# Patient Record
Sex: Male | Born: 1970 | Race: Black or African American | Hispanic: No | Marital: Single | Smoking: Never smoker
Health system: Southern US, Community
[De-identification: ages and names within clinical notes are randomized; demographics above are authoritative.]

## PROBLEM LIST (undated history)

## (undated) DIAGNOSIS — I10 Essential (primary) hypertension: Secondary | ICD-10-CM

## (undated) DIAGNOSIS — M1990 Unspecified osteoarthritis, unspecified site: Secondary | ICD-10-CM

## (undated) DIAGNOSIS — T7840XA Allergy, unspecified, initial encounter: Secondary | ICD-10-CM

## (undated) HISTORY — DX: Allergy, unspecified, initial encounter: T78.40XA

## (undated) HISTORY — DX: Essential (primary) hypertension: I10

## (undated) HISTORY — DX: Unspecified osteoarthritis, unspecified site: M19.90

---

## 1998-03-22 ENCOUNTER — Emergency Department (HOSPITAL_COMMUNITY): Admission: EM | Admit: 1998-03-22 | Discharge: 1998-03-22 | Payer: Self-pay | Admitting: Emergency Medicine

## 1998-08-01 ENCOUNTER — Encounter: Payer: Self-pay | Admitting: Orthopedic Surgery

## 1998-08-01 ENCOUNTER — Ambulatory Visit (HOSPITAL_COMMUNITY): Admission: RE | Admit: 1998-08-01 | Discharge: 1998-08-01 | Payer: Self-pay | Admitting: Orthopedic Surgery

## 1998-11-14 ENCOUNTER — Emergency Department (HOSPITAL_COMMUNITY): Admission: EM | Admit: 1998-11-14 | Discharge: 1998-11-14 | Payer: Self-pay | Admitting: Emergency Medicine

## 1999-05-28 ENCOUNTER — Emergency Department (HOSPITAL_COMMUNITY): Admission: EM | Admit: 1999-05-28 | Discharge: 1999-05-28 | Payer: Self-pay

## 2000-05-16 ENCOUNTER — Emergency Department (HOSPITAL_COMMUNITY): Admission: EM | Admit: 2000-05-16 | Discharge: 2000-05-16 | Payer: Self-pay | Admitting: Internal Medicine

## 2006-02-08 ENCOUNTER — Encounter: Admission: RE | Admit: 2006-02-08 | Discharge: 2006-02-08 | Payer: Self-pay | Admitting: Nephrology

## 2006-03-29 ENCOUNTER — Encounter: Admission: RE | Admit: 2006-03-29 | Discharge: 2006-03-29 | Payer: Self-pay | Admitting: Nephrology

## 2007-05-30 IMAGING — CR DG CHEST 2V
2 series · 2 of 2 positions shown · non-contrast
Comparison: None

CLINICAL DATA: Cough

CHEST - 2 VIEW:

[w chest pa]
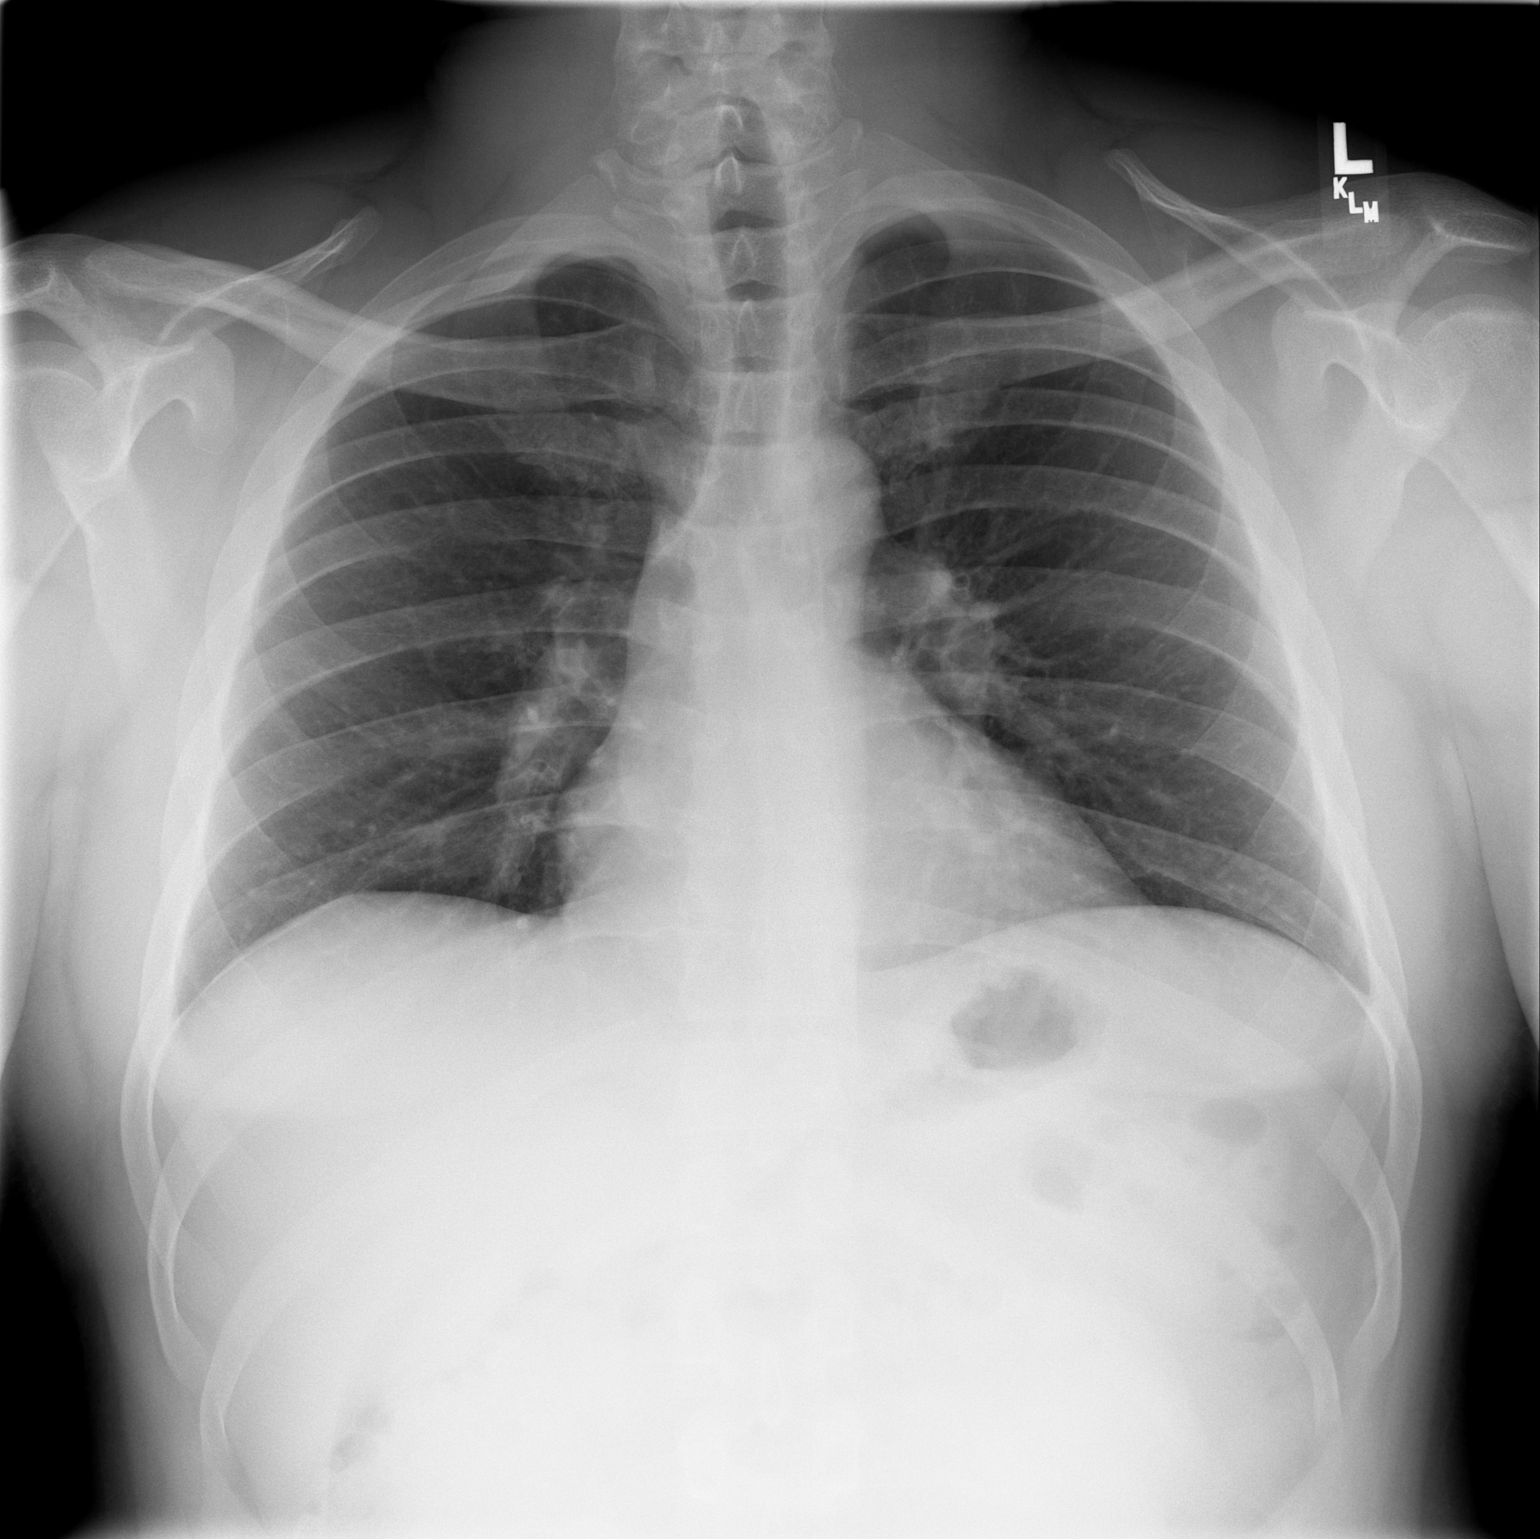

[w chest lat]
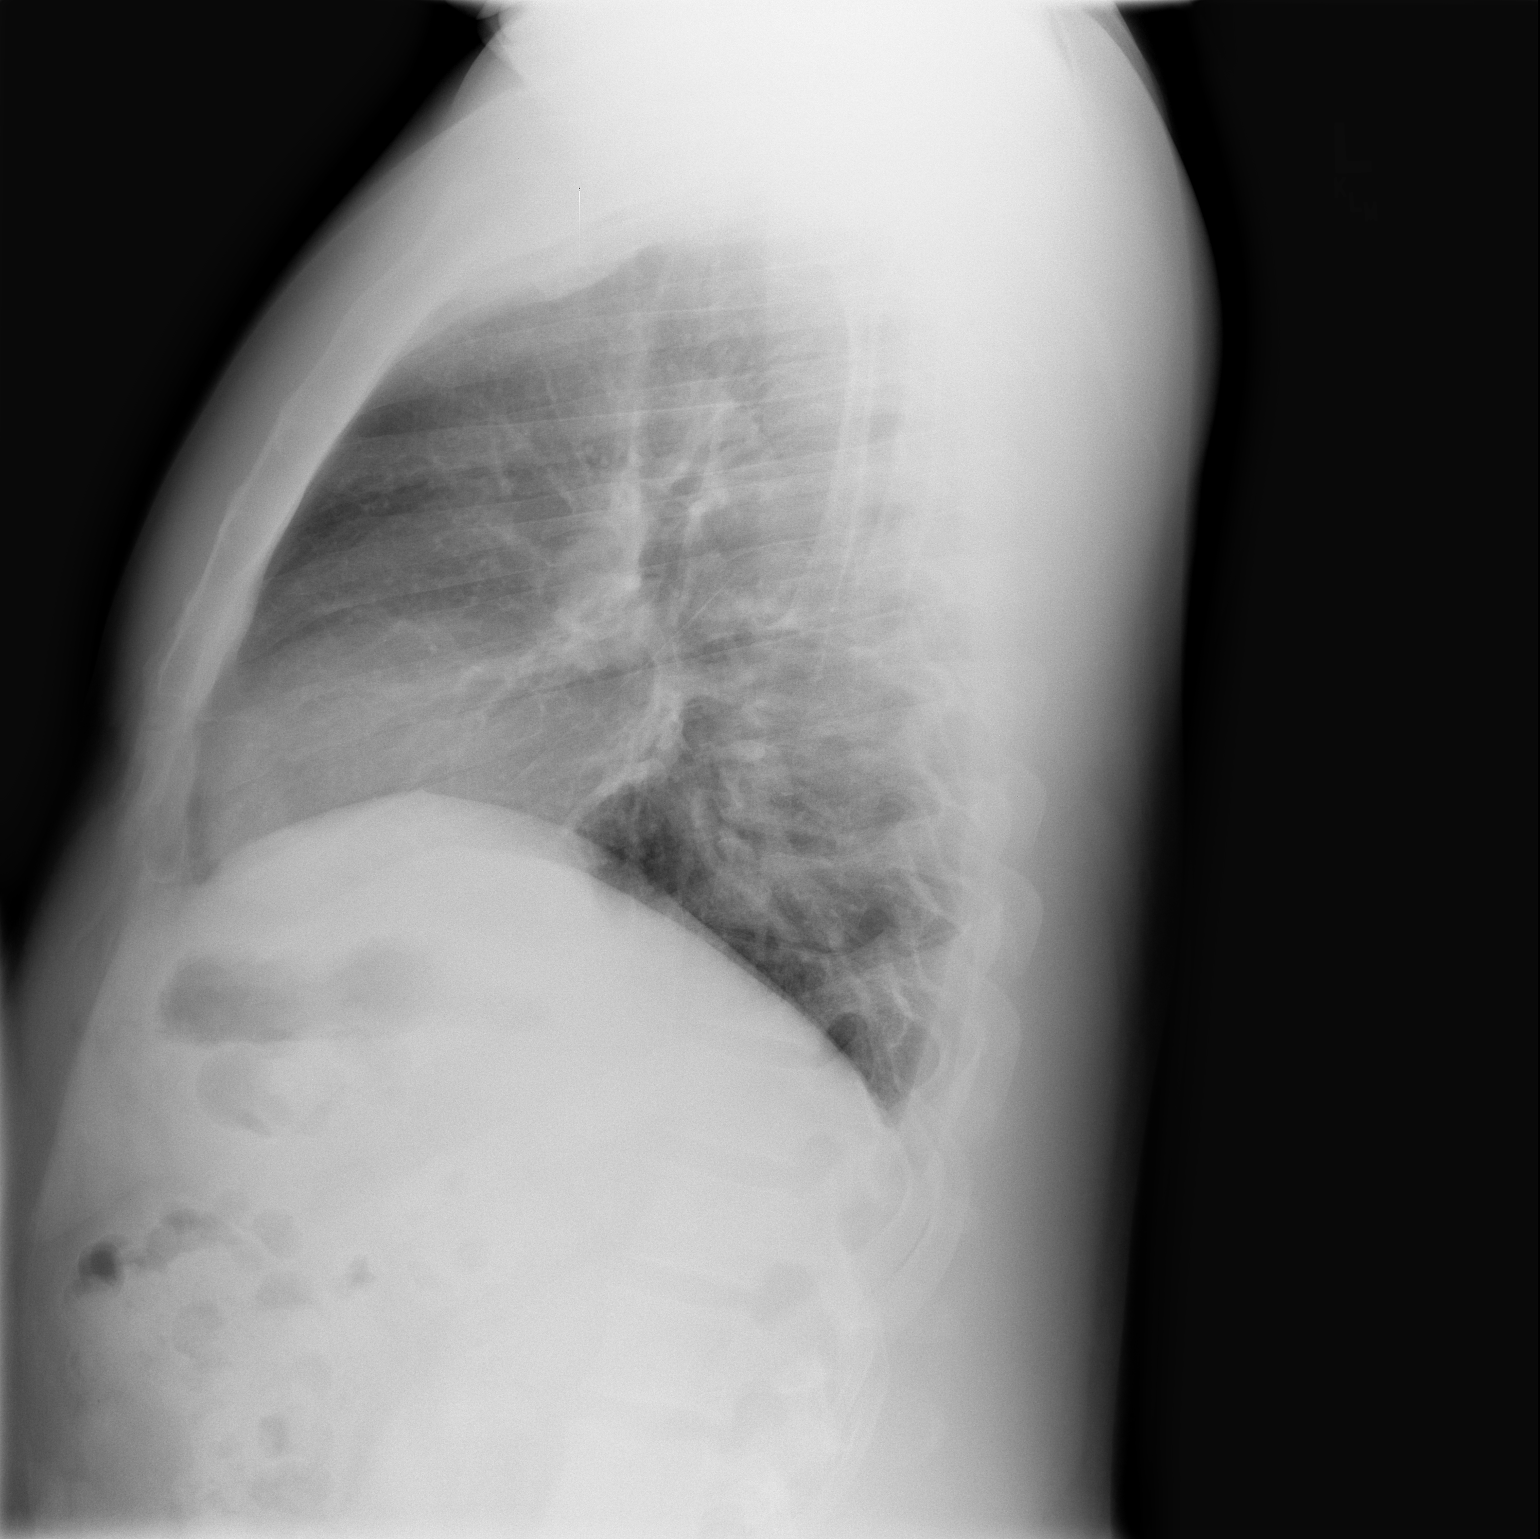

[2 of 2 positions shown; findings below may reference images not displayed]

FINDINGS: The heart size and mediastinal contours are within normal limits. 
Mild peribronchial thickening noted. No focal opacities. The visualized skeletal
structures are unremarkable.
IMPRESSION: Mild bronchitic changes.

## 2009-03-04 ENCOUNTER — Emergency Department (HOSPITAL_COMMUNITY): Admission: EM | Admit: 2009-03-04 | Discharge: 2009-03-04 | Payer: Self-pay | Admitting: Emergency Medicine

## 2009-03-07 ENCOUNTER — Emergency Department (HOSPITAL_COMMUNITY): Admission: EM | Admit: 2009-03-07 | Discharge: 2009-03-07 | Payer: Self-pay | Admitting: Emergency Medicine

## 2009-03-12 ENCOUNTER — Emergency Department (HOSPITAL_COMMUNITY): Admission: EM | Admit: 2009-03-12 | Discharge: 2009-03-12 | Payer: Self-pay | Admitting: Emergency Medicine

## 2010-01-29 ENCOUNTER — Emergency Department (HOSPITAL_COMMUNITY): Admission: EM | Admit: 2010-01-29 | Discharge: 2009-03-02 | Payer: Self-pay | Admitting: Emergency Medicine

## 2014-09-29 ENCOUNTER — Ambulatory Visit (INDEPENDENT_AMBULATORY_CARE_PROVIDER_SITE_OTHER): Payer: BC Managed Care – PPO | Admitting: Physician Assistant

## 2014-09-29 VITALS — BP 120/84 | HR 62 | Temp 97.7°F | Resp 20 | Ht 72.0 in | Wt 264.4 lb

## 2014-09-29 DIAGNOSIS — Z8679 Personal history of other diseases of the circulatory system: Secondary | ICD-10-CM | POA: Diagnosis not present

## 2014-09-29 DIAGNOSIS — R519 Headache, unspecified: Secondary | ICD-10-CM

## 2014-09-29 DIAGNOSIS — R51 Headache: Secondary | ICD-10-CM | POA: Diagnosis not present

## 2014-09-29 MED ORDER — KETOROLAC TROMETHAMINE 60 MG/2ML IM SOLN: 60.0000 mg | Freq: Once | INTRAMUSCULAR | Status: AC

## 2014-09-29 MED ORDER — LISINOPRIL-HYDROCHLOROTHIAZIDE 10-12.5 MG PO TABS: 1.0000 | ORAL_TABLET | Freq: Every day | ORAL | Status: DC

## 2014-09-29 MED ORDER — IBUPROFEN 800 MG PO TABS: ORAL_TABLET | ORAL | Status: DC

## 2014-09-29 NOTE — Progress Notes (Addendum)
09/30/2014 at 9:51 AM  Jeffery Stephenson / DOB: 01-16-1971 / MRN: [id number]  The patient  does not have a problem list on file.  SUBJECTIVE  Chief complaint: Hypertension   Jeffery Stephenson is a well appearing 44 y.o. male here today for acute right sided HA.  He has a history of migraines. He describes today's HA as right sided, non pulsatile, and reports a mild photophobia along with mild nausea.  The HA started this morning and is worsening. He has tried nothing for his HA.      He would like his blood pressure checked and his medication represcribed.  He has recently moved from [place] and would like to establish care at [facility]. He has been taking Lisinopril/HCTZ with good control of his BP.    He  has a past medical history of Hypertension.    Medications reviewed and updated by myself where necessary, and exist elsewhere in the encounter.   [name] is allergic to keflex. He  reports that he has never smoked. He does not have any smokeless tobacco history on file. He reports that he does not drink alcohol or use illicit drugs. He  has no sexual activity history on file. The patient  has no past surgical history on file.  His family history is not on file.  Review of Systems  Constitutional: Negative for fever and chills.  HENT: Negative for sore throat and tinnitus.   Eyes: Negative for blurred vision, double vision, pain, discharge and redness.  Respiratory: Negative for cough.   Cardiovascular: Negative for chest pain.  Gastrointestinal: Negative for vomiting, abdominal pain and diarrhea.  Skin: Negative for rash.  Neurological: Negative for dizziness.    OBJECTIVE  His  height is 6' (1.829 m) and weight is 264 lb 6 oz (119.92 kg). His oral temperature is 97.7 F (36.5 C). His blood pressure is 120/84 and his pulse is 62. His respiration is 20 and oxygen saturation is 98%.  The patient's body mass index is 35.85 kg/(m^2).  Physical Exam  Vitals reviewed. Constitutional: He  is oriented to person, place, and time. He appears well-developed. No distress.  Eyes: EOM are normal. Pupils are equal, round, and reactive to light. No scleral icterus.  Neck: Normal range of motion.  Cardiovascular: Normal rate and regular rhythm.   Respiratory: Effort normal and breath sounds normal.  GI: He exhibits no distension.  Musculoskeletal: Normal range of motion.  Neurological: He is alert and oriented to person, place, and time. No cranial nerve deficit.  Skin: Skin is warm and dry. He is not diaphoretic.  Psychiatric: He has a normal mood and affect.    No results found for this or any previous visit (from the past 24 hour(s)).  ASSESSMENT & PLAN  [name] was seen today for hypertension.  Diagnoses and all orders for this visit:  Acute nonintractable headache, unspecified headache type Orders: -     ibuprofen (ADVIL,MOTRIN) 800 MG tablet; Take 1 tab at the onset of headache. -     ketorolac (TORADOL) injection 60 mg; Inject 2 mLs (60 mg total) into the muscle once.  History of hypertension: Patient desiring to continue care at [facility].  Labs ordered and patient to follow up in 6 months.   Orders: -     lisinopril-hydrochlorothiazide (PRINZIDE,ZESTORETIC) 10-12.5 MG per tablet; Take 1 tablet by mouth daily. -     CBC with Differential/Platelet -     TSH -     COMPLETE  METABOLIC PANEL WITH GFR    The patient was advised to call or come back to clinic if he does not see an improvement in symptoms, or worsens with the above plan.   Philis Fendt, MHS, PA-C [facility] 09/30/2014 9:51 AM

## 2014-09-30 ENCOUNTER — Encounter: Payer: Self-pay | Admitting: Physician Assistant

## 2014-09-30 LAB — CBC WITH DIFFERENTIAL/PLATELET
Basophils Absolute: 0 10*3/uL (ref 0.0–0.1)
Basophils Relative: 1 % (ref 0–1)
Eosinophils Absolute: 0 10*3/uL (ref 0.0–0.7)
Eosinophils Relative: 1 % (ref 0–5)
HCT: 46.3 % (ref 39.0–52.0)
Hemoglobin: 15.8 g/dL (ref 13.0–17.0)
Lymphocytes Relative: 51 % — ABNORMAL HIGH (ref 12–46)
Lymphs Abs: 2.3 10*3/uL (ref 0.7–4.0)
MCH: 28.2 pg (ref 26.0–34.0)
MCHC: 34.1 g/dL (ref 30.0–36.0)
MCV: 82.7 fL (ref 78.0–100.0)
MPV: 11.1 fL (ref 8.6–12.4)
Monocytes Absolute: 0.3 10*3/uL (ref 0.1–1.0)
Monocytes Relative: 7 % (ref 3–12)
Neutro Abs: 1.8 10*3/uL (ref 1.7–7.7)
Neutrophils Relative %: 40 % — ABNORMAL LOW (ref 43–77)
Platelets: 216 10*3/uL (ref 150–400)
RBC: 5.6 MIL/uL (ref 4.22–5.81)
RDW: 13.6 % (ref 11.5–15.5)
WBC: 4.6 10*3/uL (ref 4.0–10.5)

## 2014-09-30 LAB — COMPLETE METABOLIC PANEL WITH GFR
ALT: 25 U/L (ref 9–46)
AST: 21 U/L (ref 10–40)
Albumin: 4.3 g/dL (ref 3.6–5.1)
Alkaline Phosphatase: 61 U/L (ref 40–115)
BILIRUBIN TOTAL: 0.5 mg/dL (ref 0.2–1.2)
BUN: 11 mg/dL (ref 7–25)
CO2: 27 mmol/L (ref 20–31)
Calcium: 9.6 mg/dL (ref 8.6–10.3)
Chloride: 103 mmol/L (ref 98–110)
Creat: 1.13 mg/dL (ref 0.60–1.35)
GFR, EST NON AFRICAN AMERICAN: 78 mL/min (ref >=60)
GFR, Est African American: >89 mL/min (ref >=60)
GLUCOSE: 92 mg/dL (ref 65–99)
Potassium: 4 mmol/L (ref 3.5–5.3)
Sodium: 141 mmol/L (ref 135–146)
Total Protein: 7.1 g/dL (ref 6.1–8.1)

## 2014-09-30 LAB — TSH: TSH: 1.129 u[IU]/mL (ref 0.350–4.500)

## 2014-10-02 NOTE — Addendum Note (Signed)
Addended by: Tereasa Coop on: 10/02/2014 10:27 AM   Modules accepted: Miquel Dunn

## 2014-12-17 ENCOUNTER — Ambulatory Visit (INDEPENDENT_AMBULATORY_CARE_PROVIDER_SITE_OTHER): Payer: BC Managed Care – PPO | Admitting: Emergency Medicine

## 2014-12-17 VITALS — BP 114/68 | HR 98 | Temp 98.6°F | Resp 16 | Ht 72.0 in | Wt 261.0 lb

## 2014-12-17 DIAGNOSIS — J0301 Acute recurrent streptococcal tonsillitis: Secondary | ICD-10-CM | POA: Diagnosis not present

## 2014-12-17 LAB — POCT RAPID STREP A (OFFICE): RAPID STREP A SCREEN: POSITIVE — AB

## 2014-12-17 MED ORDER — PENICILLIN V POTASSIUM 500 MG PO TABS: 500.0000 mg | ORAL_TABLET | Freq: Four times a day (QID) | ORAL | Status: DC

## 2014-12-17 NOTE — Patient Instructions (Signed)

## 2014-12-17 NOTE — Progress Notes (Signed)
Subjective:  Patient ID: Jeffery Stephenson, male    DOB: 1971/02/13  Age: 44 y.o. MRN: [id number]  CC: Sore Throat   HPI Jeffery Stephenson presents  with recurrent sore throat. He has had a number of episodes of tonsillitis this year. He now has sore throat. He has no fever chills or cough. Has no nasal congestion postnasal drainage. No pain in his ears. Was started in the last couple days.  History Alee has a past medical history of Hypertension.   He has no past surgical history on file.   His  family history is not on file.  He   reports that he has never smoked. He does not have any smokeless tobacco history on file. He reports that he does not drink alcohol or use illicit drugs.  Outpatient Prescriptions Prior to Visit  Medication Sig Dispense Refill  . ibuprofen (ADVIL,MOTRIN) 800 MG tablet Take 1 tab at the onset of headache. 30 tablet 0  . lisinopril-hydrochlorothiazide (PRINZIDE,ZESTORETIC) 10-12.5 MG per tablet Take 1 tablet by mouth daily. 30 tablet 5   No facility-administered medications prior to visit.    Social History   Social History  . Marital Status: Married    Spouse Name: N/A  . Number of Children: N/A  . Years of Education: N/A   Social History Main Topics  . Smoking status: Never Smoker   . Smokeless tobacco: None  . Alcohol Use: No  . Drug Use: No  . Sexual Activity: Not Asked   Other Topics Concern  . None   Social History Narrative     Review of Systems  Constitutional: Negative for fever, chills and appetite change.  HENT: Positive for sore throat. Negative for congestion, ear pain, postnasal drip and sinus pressure.   Eyes: Negative for pain and redness.  Respiratory: Negative for cough, shortness of breath and wheezing.   Cardiovascular: Negative for leg swelling.  Gastrointestinal: Negative for nausea, vomiting, abdominal pain, diarrhea, constipation and blood in stool.  Endocrine: Negative for polyuria.  Genitourinary: Negative  for dysuria, urgency, frequency and flank pain.  Musculoskeletal: Negative for gait problem.  Skin: Negative for rash.  Neurological: Negative for weakness and headaches.  Psychiatric/Behavioral: Negative for confusion and decreased concentration. The patient is not nervous/anxious.     Objective:  BP 114/68 mmHg  Pulse 98  Temp(Src) 98.6 F (37 C) (Oral)  Resp 16  Ht 6' (1.829 m)  Wt 261 lb (118.389 kg)  BMI 35.39 kg/m2  SpO2 98%  Physical Exam  Constitutional: He is oriented to person, place, and time. He appears well-developed and well-nourished. No distress.  HENT:  Head: Normocephalic and atraumatic.  Right Ear: External ear normal.  Left Ear: External ear normal.  Nose: Nose normal.  Mouth/Throat: Posterior oropharyngeal edema and posterior oropharyngeal erythema present. No tonsillar abscesses.  Eyes: Conjunctivae and EOM are normal. Pupils are equal, round, and reactive to light. No scleral icterus.  Neck: Normal range of motion. Neck supple. No tracheal deviation present.  Cardiovascular: Normal rate, regular rhythm and normal heart sounds.   Pulmonary/Chest: Effort normal. No respiratory distress. He has no wheezes. He has no rales.  Abdominal: He exhibits no mass. There is no tenderness. There is no rebound and no guarding.  Musculoskeletal: He exhibits no edema.  Lymphadenopathy:    He has no cervical adenopathy.  Neurological: He is alert and oriented to person, place, and time. Coordination normal.  Skin: Skin is warm and dry. No rash noted.  Psychiatric: He has a normal mood and affect. His behavior is normal.      Assessment & Plan:   [name] was seen today for sore throat.  Diagnoses and all orders for this visit:  Acute recurrent streptococcal tonsillitis -     POCT rapid strep A   I am having Mr. Zanni maintain his lisinopril-hydrochlorothiazide and ibuprofen.  No orders of the defined types were placed in this encounter.    Appropriate red  flag conditions were discussed with the patient as well as actions that should be taken.  Patient expressed his understanding.  Follow-up: No Follow-up on file.  Roselee Culver, MD

## 2014-12-22 ENCOUNTER — Telehealth: Payer: Self-pay | Admitting: Emergency Medicine

## 2014-12-22 NOTE — Telephone Encounter (Signed)
Patient is suffering with a severe cough. Patient was seen on 12/17/2014. Patient is requesting a medication for the cough. [facility] on [address] [phone].

## 2014-12-23 ENCOUNTER — Encounter (HOSPITAL_COMMUNITY): Payer: Self-pay | Admitting: Family Medicine

## 2014-12-23 ENCOUNTER — Emergency Department (HOSPITAL_COMMUNITY)
Admission: EM | Admit: 2014-12-23 | Discharge: 2014-12-23 | Disposition: A | Discharge status: Home / Self Care | Payer: BC Managed Care – PPO | Attending: Emergency Medicine | Admitting: Emergency Medicine

## 2014-12-23 DIAGNOSIS — Z79899 Other long term (current) drug therapy: Secondary | ICD-10-CM | POA: Diagnosis not present

## 2014-12-23 DIAGNOSIS — Z792 Long term (current) use of antibiotics: Secondary | ICD-10-CM | POA: Diagnosis not present

## 2014-12-23 DIAGNOSIS — J029 Acute pharyngitis, unspecified: Secondary | ICD-10-CM | POA: Diagnosis not present

## 2014-12-23 DIAGNOSIS — I10 Essential (primary) hypertension: Secondary | ICD-10-CM | POA: Diagnosis not present

## 2014-12-23 DIAGNOSIS — R05 Cough: Secondary | ICD-10-CM

## 2014-12-23 DIAGNOSIS — R059 Cough, unspecified: Secondary | ICD-10-CM

## 2014-12-23 MED ORDER — NAPROXEN 250 MG PO TABS: 250.0000 mg | ORAL_TABLET | Freq: Two times a day (BID) | ORAL | Status: DC

## 2014-12-23 MED ORDER — FLUTICASONE PROPIONATE 50 MCG/ACT NA SUSP: 2.0000 | Freq: Every day | NASAL | Status: AC

## 2014-12-23 MED ORDER — BENZONATATE 100 MG PO CAPS: 100.0000 mg | ORAL_CAPSULE | Freq: Three times a day (TID) | ORAL | Status: DC | PRN

## 2014-12-23 MED ORDER — DEXAMETHASONE SODIUM PHOSPHATE 10 MG/ML IJ SOLN
10.0000 mg | Freq: Once | INTRAMUSCULAR | Status: AC
  Administered 2014-12-23: 10 mg via INTRAVENOUS
  Filled 2014-12-23: qty 1

## 2014-12-23 MED ORDER — CETIRIZINE HCL 10 MG PO TABS: 10.0000 mg | ORAL_TABLET | Freq: Every day | ORAL | Status: DC

## 2014-12-23 NOTE — ED Notes (Signed)
Pt c/o continuous sore throat and cough. Taking [facility], but does not have relief.

## 2014-12-23 NOTE — ED Notes (Signed)
Pt reports he was diagnosed with strep throat on Monday. Given Penicillin with little improvement. Also complains of a dry, non productive cough.

## 2014-12-23 NOTE — ED Notes (Signed)
MD at bedside. 

## 2014-12-23 NOTE — Telephone Encounter (Signed)
Ask him to try some Robitussin DM.  If that does not work, I can write for tussionex.

## 2014-12-23 NOTE — Addendum Note (Signed)
Addended by: Roselee Culver on: 12/23/2014 02:17 PM   Modules accepted: Miquel Dunn

## 2014-12-23 NOTE — Discharge Instructions (Signed)
Pharyngitis Pharyngitis is redness, pain, and swelling (inflammation) of your pharynx.  CAUSES  Pharyngitis is usually caused by infection. Most of the time, these infections are from viruses (viral) and are part of a cold. However, sometimes pharyngitis is caused by bacteria (bacterial). Pharyngitis can also be caused by allergies. Viral pharyngitis may be spread from person to person by coughing, sneezing, and personal items or utensils (cups, forks, spoons, toothbrushes). Bacterial pharyngitis may be spread from person to person by more intimate contact, such as kissing.  SIGNS AND SYMPTOMS  Symptoms of pharyngitis include:   Sore throat.   Tiredness (fatigue).   Low-grade fever.   Headache.  Joint pain and muscle aches.  Skin rashes.  Swollen lymph nodes.  Plaque-like film on throat or tonsils (often seen with bacterial pharyngitis). DIAGNOSIS  Your health care provider will ask you questions about your illness and your symptoms. Your medical history, along with a physical exam, is often all that is needed to diagnose pharyngitis. Sometimes, a rapid strep test is done. Other lab tests may also be done, depending on the suspected cause.  TREATMENT  Viral pharyngitis will usually get better in 3-4 days without the use of medicine. Bacterial pharyngitis is treated with medicines that kill germs (antibiotics).  HOME CARE INSTRUCTIONS   Drink enough water and fluids to keep your urine clear or pale yellow.   Only take over-the-counter or prescription medicines as directed by your health care provider:   If you are prescribed antibiotics, make sure you finish them even if you start to feel better.   Do not take aspirin.   Get lots of rest.   Gargle with 8 oz of salt water ( tsp of salt per 1 qt of water) as often as every 1-2 hours to soothe your throat.   Throat lozenges (if you are not at risk for choking) or sprays may be used to soothe your throat. SEEK MEDICAL  CARE IF:   You have large, tender lumps in your neck.  You have a rash.  You cough up green, yellow-brown, or bloody spit. SEEK IMMEDIATE MEDICAL CARE IF:   Your neck becomes stiff.  You drool or are unable to swallow liquids.  You vomit or are unable to keep medicines or liquids down.  You have severe pain that does not go away with the use of recommended medicines.  You have trouble breathing (not caused by a stuffy nose). MAKE SURE YOU:   Understand these instructions.  Will watch your condition.  Will get help right away if you are not doing well or get worse.   This information is not intended to replace advice given to you by your health care provider. Make sure you discuss any questions you have with your health care provider.   Document Released: 02/08/2005 Document Revised: 11/29/2012 Document Reviewed: 10/16/2012 Elsevier Interactive Patient Education 2016 [employer]  Cough, Adult Coughing is a reflex that clears your throat and your airways. Coughing helps to heal and protect your lungs. It is normal to cough occasionally, but a cough that happens with other symptoms or lasts a long time may be a sign of a condition that needs treatment. A cough may last only 2-3 weeks (acute), or it may last longer than 8 weeks (chronic). CAUSES Coughing is commonly caused by:  Breathing in substances that irritate your lungs.  A viral or bacterial respiratory infection.  Allergies.  Asthma.  Postnasal drip.  Smoking.  Acid backing up from the stomach  into the esophagus (gastroesophageal reflux).  Certain medicines.  Chronic lung problems, including COPD (or rarely, lung cancer).  Other medical conditions such as heart failure. HOME CARE INSTRUCTIONS  Pay attention to any changes in your symptoms. Take these actions to help with your discomfort:  Take medicines only as told by your health care provider.  If you were prescribed an antibiotic medicine, take it  as told by your health care provider. Do not stop taking the antibiotic even if you start to feel better.  Talk with your health care provider before you take a cough suppressant medicine.  Drink enough fluid to keep your urine clear or pale yellow.  If the air is dry, use a cold steam vaporizer or humidifier in your bedroom or your home to help loosen secretions.  Avoid anything that causes you to cough at work or at home.  If your cough is worse at night, try sleeping in a semi-upright position.  Avoid cigarette smoke. If you smoke, quit smoking. If you need help quitting, ask your health care provider.  Avoid caffeine.  Avoid alcohol.  Rest as needed. SEEK MEDICAL CARE IF:   You have new symptoms.  You cough up pus.  Your cough does not get better after 2-3 weeks, or your cough gets worse.  You cannot control your cough with suppressant medicines and you are losing sleep.  You develop pain that is getting worse or pain that is not controlled with pain medicines.  You have a fever.  You have unexplained weight loss.  You have night sweats. SEEK IMMEDIATE MEDICAL CARE IF:  You cough up blood.  You have difficulty breathing.  Your heartbeat is very fast.   This information is not intended to replace advice given to you by your health care provider. Make sure you discuss any questions you have with your health care provider.   Document Released: 08/07/2010 Document Revised: 10/30/2014 Document Reviewed: 04/17/2014 Elsevier Interactive Patient Education 2016 [employer].  Allergies An allergy is an abnormal reaction to a substance by the body's defense system (immune system). Allergies can develop at any age. WHAT CAUSES ALLERGIES? An allergic reaction happens when the immune system mistakenly reacts to a normally harmless substance, called an allergen, as if it were harmful. The immune system releases antibodies to fight the substance. Antibodies eventually  release a chemical called histamine into the bloodstream. The release of histamine is meant to protect the body from infection, but it also causes discomfort. An allergic reaction can be triggered by:  Eating an allergen.  Inhaling an allergen.  Touching an allergen. WHAT TYPES OF ALLERGIES ARE THERE? There are many types of allergies. Common types include:  Seasonal allergies. People with this type of allergy are usually allergic to substances that are only present during certain seasons, such as molds and pollens.  Food allergies.  Drug allergies.  Insect allergies.  Animal dander allergies. WHAT ARE SYMPTOMS OF ALLERGIES? Possible allergy symptoms include:  Swelling of the lips, face, tongue, mouth, or throat.  Sneezing, coughing, or wheezing.  Nasal congestion.  Tingling in the mouth.  Rash.  Itching.  Itchy, red, swollen areas of skin (hives).  Watery eyes.  Vomiting.  Diarrhea.  Dizziness.  Lightheadedness.  Fainting.  Trouble breathing or swallowing.  Chest tightness.  Rapid heartbeat. HOW ARE ALLERGIES DIAGNOSED? Allergies are diagnosed with a medical and family history and one or more of the following:  Skin tests.  Blood tests.  A food diary. A food  diary is a record of all the foods and drinks you have in a day and of all the symptoms you experience.  The results of an elimination diet. An elimination diet involves eliminating foods from your diet and then adding them back in one by one to find out if a certain food causes an allergic reaction. HOW ARE ALLERGIES TREATED? There is no cure for allergies, but allergic reactions can be treated with medicine. Severe reactions usually need to be treated at a hospital. HOW CAN REACTIONS BE PREVENTED? The best way to prevent an allergic reaction is by avoiding the substance you are allergic to. Allergy shots and medicines can also help prevent reactions in some cases. People with severe allergic  reactions may be able to prevent a life-threatening reaction called anaphylaxis with a medicine given right after exposure to the allergen.   This information is not intended to replace advice given to you by your health care provider. Make sure you discuss any questions you have with your health care provider.   Document Released: 05/04/2002 Document Revised: 03/01/2014 Document Reviewed: 11/20/2013 Elsevier Interactive Patient Education [employer].

## 2014-12-23 NOTE — ED Provider Notes (Signed)
CSN: [id number]     Arrival date & time 12/23/14  0217 History   First MD Initiated Contact with Patient 12/23/14 0601     Chief Complaint  Patient presents with  . Cough  . Sore Throat   Jeffery Stephenson is a 44 y.o. male with a history of hypertension who presents to the emergency department complaining of a sore throat and cough. Patient reports he's had a sore throat for the past week has been coughing for the past 2 days. He reports he was seen by urgent care this past Tuesday and was diagnosed with strep pharyngitis. He was started on penicillin. He reports he is still having a sore throat and has pain with swallowing. He reports he's been coughing more over the past 2 days. He reports associate symptoms of postnasal drip, chills, and nasal congestion. He reports using intermittent ibuprofen recently. He reports he's been compliant with taking his penicillin. The patient denies fevers, wheezing, shortness of breath, chest pain, ear pain, nosebleeds, trouble swallowing, neck pain, or neck stiffness.  (Consider location/radiation/quality/duration/timing/severity/associated sxs/prior Treatment) HPI  Past Medical History  Diagnosis Date  . Hypertension    History reviewed. No pertinent past surgical history. History reviewed. No pertinent family history. Social History  Substance Use Topics  . Smoking status: Never Smoker   . Smokeless tobacco: None  . Alcohol Use: No    Review of Systems  Constitutional: Positive for chills. Negative for fever.  HENT: Positive for congestion, postnasal drip, rhinorrhea and sore throat. Negative for ear pain, mouth sores and trouble swallowing.   Eyes: Negative for visual disturbance.  Respiratory: Positive for cough. Negative for shortness of breath and wheezing.   Cardiovascular: Negative for chest pain.  Gastrointestinal: Negative for nausea, vomiting and abdominal pain.  Genitourinary: Negative for dysuria.  Musculoskeletal: Negative for neck  pain and neck stiffness.  Skin: Negative for rash.  Neurological: Negative for light-headedness and headaches.      Allergies  Keflex  Home Medications   Prior to Admission medications   Medication Sig Start Date End Date Taking? Authorizing Provider  ibuprofen (ADVIL,MOTRIN) 800 MG tablet Take 1 tab at the onset of headache. 09/29/14  Yes Tereasa Coop, PA-C  lisinopril-hydrochlorothiazide (PRINZIDE,ZESTORETIC) 10-12.5 MG per tablet Take 1 tablet by mouth daily. 09/29/14  Yes Tereasa Coop, PA-C  penicillin v potassium (VEETID) 500 MG tablet Take 1 tablet (500 mg total) by mouth 4 (four) times daily. 12/17/14  Yes Roselee Culver, MD  benzonatate (TESSALON) 100 MG capsule Take 1 capsule (100 mg total) by mouth 3 (three) times daily as needed for cough. 12/23/14   Waynetta Pean, PA-C  cetirizine (ZYRTEC ALLERGY) 10 MG tablet Take 1 tablet (10 mg total) by mouth daily. 12/23/14   Waynetta Pean, PA-C  fluticasone (FLONASE) 50 MCG/ACT nasal spray Place 2 sprays into both nostrils daily. 12/23/14   Waynetta Pean, PA-C  naproxen (NAPROSYN) 250 MG tablet Take 1 tablet (250 mg total) by mouth 2 (two) times daily with a meal. 12/23/14   Waynetta Pean, PA-C   BP 131/78 mmHg  Pulse 88  Temp(Src) 98.3 F (36.8 C) (Oral)  Resp 18  Ht 5\' 10"  (1.778 m)  Wt 260 lb (117.935 kg)  BMI 37.31 kg/m2  SpO2 100% Physical Exam  Constitutional: He is oriented to person, place, and time. He appears well-developed and well-nourished. No distress.  Nontoxic appearing.  HENT:  Head: Normocephalic and atraumatic.  Right Ear: External ear normal.  Left Ear:  External ear normal.  Bilateral mild tonsillar hypertrophy without exudates. Uvula is midline without edema. No posterior oropharyngeal edema. No peritonsillar abscess. No drooling. No trismus. Mild bilateral middle ear effusion. No TM erythema or loss of landmarks. Boggy nasal turbinates bilaterally. No maxillary or frontal sinus tenderness to  palpation.  Eyes: Conjunctivae are normal. Pupils are equal, round, and reactive to light. Right eye exhibits no discharge. Left eye exhibits no discharge.  Neck: Normal range of motion. Neck supple. No JVD present. No tracheal deviation present.  Cardiovascular: Normal rate, regular rhythm, normal heart sounds and intact distal pulses.   Pulmonary/Chest: Effort normal and breath sounds normal. No respiratory distress. He has no wheezes. He has no rales.  Lungs are clear to auscultation bilaterally.  Abdominal: Soft. There is no tenderness. There is no guarding.  Lymphadenopathy:    He has no cervical adenopathy.  Neurological: He is alert and oriented to person, place, and time. Coordination normal.  Skin: Skin is warm and dry. No rash noted. He is not diaphoretic. No erythema. No pallor.  Psychiatric: He has a normal mood and affect. His behavior is normal.  Nursing note and vitals reviewed.   ED Course  Procedures (including critical care time) Labs Review Labs Reviewed - No data to display  Imaging Review No results found.    EKG Interpretation None      Filed Vitals:   12/23/14 0245 12/23/14 0429  BP: 141/87 131/78  Pulse: 99 88  Temp: 98.3 F (36.8 C)   TempSrc: Oral   Resp: 20 18  Height: 5\' 10"  (1.778 m)   Weight: 260 lb (117.935 kg)   SpO2: 99% 100%     MDM   Meds given in ED:  Medications  dexamethasone (DECADRON) injection 10 mg (10 mg Intravenous Given 12/23/14 0635)    New Prescriptions   BENZONATATE (TESSALON) 100 MG CAPSULE    Take 1 capsule (100 mg total) by mouth 3 (three) times daily as needed for cough.   CETIRIZINE (ZYRTEC ALLERGY) 10 MG TABLET    Take 1 tablet (10 mg total) by mouth daily.   FLUTICASONE (FLONASE) 50 MCG/ACT NASAL SPRAY    Place 2 sprays into both nostrils daily.   NAPROXEN (NAPROSYN) 250 MG TABLET    Take 1 tablet (250 mg total) by mouth 2 (two) times daily with a meal.    Final diagnoses:  Sore throat  Cough   This  is  a 44 y.o. male with a history of hypertension who presents to the emergency department complaining of a sore throat and cough. Patient reports he's had a sore throat for the past week has been coughing for the past 2 days. He reports he was seen by urgent care this past Tuesday and was diagnosed with strep pharyngitis. He was started on penicillin. He reports he is still having a sore throat and has pain with swallowing. He reports he's been coughing more over the past 2 days. He reports associate symptoms of postnasal drip, chills, and nasal congestion. On exam the patient is afebrile nontoxic appearing. He has mild bilateral tonsillar hypertrophy without exudates. Uvula is midline. No evidence of peritonsillar abscess. He is handling his secretions without difficulty. His voice is clear and coherent. No trismus. Good range of motion of his neck. Lungs good auscultation bilaterally. No wheezes. Oxygen saturation is 100% on room air. Patient with continued sore throat despite treatment with penicillin antibiotics. Will provide with Decadron injection and prescriptions for [employer], Zyrtec,  Flonase and naproxen. I advised for him to continue using his penicillin antibiotic until he is completed it. I advised him to follow-up with his primary care provider and his ear nose and throat provider. Advised strict return precautions. I advised the patient to follow-up with their primary care provider this week. I advised the patient to return to the emergency department with new or worsening symptoms or new concerns. The patient verbalized understanding and agreement with plan.       Waynetta Pean, PA-C 12/23/14 5597  Rolland Porter, MD 12/23/14 [phone]

## 2014-12-24 NOTE — Telephone Encounter (Signed)
Pt went to ED on 12/23/2014

## 2015-01-18 ENCOUNTER — Other Ambulatory Visit: Payer: Self-pay

## 2015-01-18 DIAGNOSIS — Z8679 Personal history of other diseases of the circulatory system: Secondary | ICD-10-CM

## 2015-01-18 MED ORDER — LISINOPRIL-HYDROCHLOROTHIAZIDE 10-12.5 MG PO TABS: 1.0000 | ORAL_TABLET | Freq: Every day | ORAL | Status: DC

## 2015-02-01 ENCOUNTER — Ambulatory Visit (INDEPENDENT_AMBULATORY_CARE_PROVIDER_SITE_OTHER): Payer: BC Managed Care – PPO | Admitting: Family Medicine

## 2015-02-01 VITALS — BP 128/80 | HR 92 | Temp 98.6°F | Resp 16 | Ht 71.0 in | Wt 261.0 lb

## 2015-02-01 DIAGNOSIS — J0101 Acute recurrent maxillary sinusitis: Secondary | ICD-10-CM

## 2015-02-01 MED ORDER — AMOXICILLIN-POT CLAVULANATE 875-125 MG PO TABS: 1.0000 | ORAL_TABLET | Freq: Two times a day (BID) | ORAL | Status: DC

## 2015-02-01 NOTE — Patient Instructions (Signed)

## 2015-02-01 NOTE — Progress Notes (Signed)
Patient ID: Jeffery Stephenson, male   DOB: Aug 20, 1970, 44 y.o.   MRN: [id number]   This chart was scribed for Robyn Haber, MD by [facility], medical scribe at [facility].The patient was seen in exam room 11 and the patient's care was started at 2:42 PM.  Patient ID: Jeffery Stephenson MRN: [id number], DOB: 04-May-1970, 44 y.o. Date of Encounter: 02/01/2015  Primary Physician: No primary care provider on file.  Chief Complaint:  Chief Complaint  Patient presents with   Sinus Problem    x 5 days    HPI:  Jeffery Stephenson is a 44 y.o. male who presents to [facility] complaining of sinus problems for 5 days. Tried Flonase once a day since Monday, and Sudafed D for little relief. Cough is producing a thick bloody, green mucous. Nasal discharge is also thick, bloody and green. Hx of sinus infections. Works as a Careers information officer at [employer].  Past Medical History  Diagnosis Date   Hypertension     Home Meds: Prior to Admission medications   Medication Sig Start Date End Date Taking? Authorizing Provider  fluticasone (FLONASE) 50 MCG/ACT nasal spray Place 2 sprays into both nostrils daily. 12/23/14  Yes Waynetta Pean, PA-C  lisinopril-hydrochlorothiazide (PRINZIDE,ZESTORETIC) 10-12.5 MG tablet Take 1 tablet by mouth daily. 01/18/15  Yes Tereasa Coop, PA-C   Allergies:  Allergies  Allergen Reactions   Keflex [Cephalexin] Rash    Skin rash after taking for days   Social History   Social History   Marital Status: Single    Spouse Name: N/A   Number of Children: N/A   Years of Education: N/A   Occupational History   Not on file.   Social History Main Topics   Smoking status: Never Smoker    Smokeless tobacco: Never Used   Alcohol Use: No   Drug Use: No   Sexual Activity: Not on file   Other Topics Concern   Not on file   Social History Narrative    Review of Systems: Constitutional: negative for chills, fever, night  sweats, weight changes, or fatigue  HEENT: negative for vision changes, hearing loss, ST, epistaxis. Positive for congestion, rhinorrhea, cough, sinus pressure. Cardiovascular: negative for chest pain or palpitations Respiratory: negative for hemoptysis, wheezing, shortness of breath, or cough Abdominal: negative for abdominal pain, nausea, vomiting, diarrhea, or constipation Dermatological: negative for rash Neurologic: negative for headache, dizziness, or syncope All other systems reviewed and are otherwise negative with the exception to those above and in the HPI.  Physical Exam: Blood pressure 128/80, pulse 92, temperature 98.6 F (37 C), temperature source Oral, resp. rate 16, height 5\' 11"  (1.803 m), weight 261 lb (118.389 kg), SpO2 98 %., Body mass index is 36.42 kg/(m^2). General: Well developed, well nourished, in no acute distress. Head: Normocephalic, atraumatic, eyes without discharge, sclera non-icteric, nares are without discharge. Bilateral auditory canals clear. Bilateral narrowing of nasal passages with bleeding areas in the septums. Purulent discharge. Throat has post nasal drainage.  Neck: Supple. No thyromegaly. Full ROM. No lymphadenopathy. Lungs: Clear bilaterally to auscultation without wheezes, rales, or rhonchi. Breathing is unlabored. Heart: RRR with S1 S2. No murmurs, rubs, or gallops appreciated. Abdomen: Soft, non-tender, non-distended with normoactive bowel sounds. No hepatomegaly. No rebound/guarding. No obvious abdominal masses. Msk:  Strength and tone normal for age. Extremities/Skin: Warm and dry. No clubbing or cyanosis. No edema. No rashes or suspicious lesions. Neuro: Alert and oriented X  3. Moves all extremities spontaneously. Gait is normal. CNII-XII grossly in tact. Psych:  Responds to questions appropriately with a normal affect.    ASSESSMENT AND PLAN:  44 y.o. year old male with  This chart was scribed in my presence and reviewed by me  personally.    ICD-9-CM ICD-10-CM   1. Acute recurrent maxillary sinusitis 461.0 J01.01 amoxicillin-clavulanate (AUGMENTIN) 875-125 MG tablet     Signed, Robyn Haber, MD   By signing my name below, I, Nadim Abuhashem, attest that this documentation has been prepared under the direction and in the presence of Robyn Haber, MD.  Electronically Signed: Lora Havens, medical scribe. 02/01/2015 2:56 PM.

## 2015-04-29 ENCOUNTER — Ambulatory Visit (INDEPENDENT_AMBULATORY_CARE_PROVIDER_SITE_OTHER): Payer: BC Managed Care – PPO | Admitting: Family Medicine

## 2015-04-29 VITALS — BP 128/82 | HR 82 | Temp 98.1°F | Resp 16 | Ht 69.75 in | Wt 264.4 lb

## 2015-04-29 DIAGNOSIS — J069 Acute upper respiratory infection, unspecified: Secondary | ICD-10-CM | POA: Diagnosis not present

## 2015-04-29 DIAGNOSIS — J029 Acute pharyngitis, unspecified: Secondary | ICD-10-CM

## 2015-04-29 LAB — POCT RAPID STREP A (OFFICE): RAPID STREP A SCREEN: NEGATIVE

## 2015-04-29 NOTE — Patient Instructions (Addendum)
Your strep test was negative, I will check a throat culture, and let you know if that is positive. For now can try Saline nasal spray at least 4 times per day, over the counter Cepacol or other lozenge as needed, drink plenty of fluids. Return to the clinic or go to the nearest emergency room if any of your symptoms worsen or new symptoms occur.  Sore Throat A sore throat is pain, burning, irritation, or scratchiness of the throat. There is often pain or tenderness when swallowing or talking. A sore throat may be accompanied by other symptoms, such as coughing, sneezing, fever, and swollen neck glands. A sore throat is often the first sign of another sickness, such as a cold, flu, strep throat, or mononucleosis (commonly known as mono). Most sore throats go away without medical treatment. CAUSES  The most common causes of a sore throat include:  A viral infection, such as a cold, flu, or mono.  A bacterial infection, such as strep throat, tonsillitis, or whooping cough.  Seasonal allergies.  Dryness in the air.  Irritants, such as smoke or pollution.  Gastroesophageal reflux disease (GERD). HOME CARE INSTRUCTIONS   Only take over-the-counter medicines as directed by your caregiver.  Drink enough fluids to keep your urine clear or pale yellow.  Rest as needed.  Try using throat sprays, lozenges, or sucking on hard candy to ease any pain (if older than 4 years or as directed).  Sip warm liquids, such as broth, herbal tea, or warm water with honey to relieve pain temporarily. You may also eat or drink cold or frozen liquids such as frozen ice pops.  Gargle with salt water (mix 1 tsp salt with 8 oz of water).  Do not smoke and avoid secondhand smoke.  Put a cool-mist humidifier in your bedroom at night to moisten the air. You can also turn on a hot shower and sit in the bathroom with the door closed for 5-10 minutes. SEEK IMMEDIATE MEDICAL CARE IF:  You have difficulty  breathing.  You are unable to swallow fluids, soft foods, or your saliva.  You have increased swelling in the throat.  Your sore throat does not get better in 7 days.  You have nausea and vomiting.  You have a fever or persistent symptoms for more than 2-3 days.  You have a fever and your symptoms suddenly get worse. MAKE SURE YOU:   Understand these instructions.  Will watch your condition.  Will get help right away if you are not doing well or get worse.   This information is not intended to replace advice given to you by your health care provider. Make sure you discuss any questions you have with your health care provider.   Document Released: 03/18/2004 Document Revised: 03/01/2014 Document Reviewed: 10/17/2011 Elsevier Interactive Patient Education 2016 [employer]  Upper Respiratory Infection, Adult Most upper respiratory infections (URIs) are a viral infection of the air passages leading to the lungs. A URI affects the nose, throat, and upper air passages. The most common type of URI is nasopharyngitis and is typically referred to as "the common cold." URIs run their course and usually go away on their own. Most of the time, a URI does not require medical attention, but sometimes a bacterial infection in the upper airways can follow a viral infection. This is called a secondary infection. Sinus and middle ear infections are common types of secondary upper respiratory infections. Bacterial pneumonia can also complicate a URI. A URI can  worsen asthma and chronic obstructive pulmonary disease (COPD). Sometimes, these complications can require emergency medical care and may be life threatening.  CAUSES Almost all URIs are caused by viruses. A virus is a type of germ and can spread from one person to another.  RISKS FACTORS You may be at risk for a URI if:   You smoke.   You have chronic heart or lung disease.  You have a weakened defense (immune) system.   You are  very young or very old.   You have nasal allergies or asthma.  You work in crowded or poorly ventilated areas.  You work in health care facilities or schools. SIGNS AND SYMPTOMS  Symptoms typically develop 2-3 days after you come in contact with a cold virus. Most viral URIs last 7-10 days. However, viral URIs from the influenza virus (flu virus) can last 14-18 days and are typically more severe. Symptoms may include:   Runny or stuffy (congested) nose.   Sneezing.   Cough.   Sore throat.   Headache.   Fatigue.   Fever.   Loss of appetite.   Pain in your forehead, behind your eyes, and over your cheekbones (sinus pain).  Muscle aches.  DIAGNOSIS  Your health care provider may diagnose a URI by:  Physical exam.  Tests to check that your symptoms are not due to another condition such as:  Strep throat.  Sinusitis.  Pneumonia.  Asthma. TREATMENT  A URI goes away on its own with time. It cannot be cured with medicines, but medicines may be prescribed or recommended to relieve symptoms. Medicines may help:  Reduce your fever.  Reduce your cough.  Relieve nasal congestion. HOME CARE INSTRUCTIONS   Take medicines only as directed by your health care provider.   Gargle warm saltwater or take cough drops to comfort your throat as directed by your health care provider.  Use a warm mist humidifier or inhale steam from a shower to increase air moisture. This may make it easier to breathe.  Drink enough fluid to keep your urine clear or pale yellow.   Eat soups and other clear broths and maintain good nutrition.   Rest as needed.   Return to work when your temperature has returned to normal or as your health care provider advises. You may need to stay home longer to avoid infecting others. You can also use a face mask and careful hand washing to prevent spread of the virus.  Increase the usage of your inhaler if you have asthma.   Do not use any  tobacco products, including cigarettes, chewing tobacco, or electronic cigarettes. If you need help quitting, ask your health care provider. PREVENTION  The best way to protect yourself from getting a cold is to practice good hygiene.   Avoid oral or hand contact with people with cold symptoms.   Wash your hands often if contact occurs.  There is no clear evidence that vitamin C, vitamin E, echinacea, or exercise reduces the chance of developing a cold. However, it is always recommended to get plenty of rest, exercise, and practice good nutrition.  SEEK MEDICAL CARE IF:   You are getting worse rather than better.   Your symptoms are not controlled by medicine.   You have chills.  You have worsening shortness of breath.  You have brown or red mucus.  You have yellow or brown nasal discharge.  You have pain in your face, especially when you bend forward.  You have a  fever.  You have swollen neck glands.  You have pain while swallowing.  You have white areas in the back of your throat. SEEK IMMEDIATE MEDICAL CARE IF:   You have severe or persistent:  Headache.  Ear pain.  Sinus pain.  Chest pain.  You have chronic lung disease and any of the following:  Wheezing.  Prolonged cough.  Coughing up blood.  A change in your usual mucus.  You have a stiff neck.  You have changes in your:  Vision.  Hearing.  Thinking.  Mood. MAKE SURE YOU:   Understand these instructions.  Will watch your condition.  Will get help right away if you are not doing well or get worse.   This information is not intended to replace advice given to you by your health care provider. Make sure you discuss any questions you have with your health care provider.   Document Released: 08/04/2000 Document Revised: 06/25/2014 Document Reviewed: 05/16/2013 Elsevier Interactive Patient Education [employer].

## 2015-04-29 NOTE — Progress Notes (Signed)
Subjective:  This chart was scribed for Jeffery Ray MD,  by Jeffery Stephenson, at [facility].  This patient was seen in room 3 and the patient's care was started at 8:58 AM.   Chief Complaint  Patient presents with  . Sore Throat     Patient ID: Jeffery Stephenson, male    DOB: 01-24-1971, 45 y.o.   MRN: [id number]  HPI  HPI Comments: Jeffery Stephenson is a 45 y.o. male who presents to the [facility] complaining of a sore throat onset yesterday morning.  He states that he has associated symptoms of coughing and irritation.  He notes that he was coughing up small amounts of mucous (greenish yellow) with blood yesterday but does not have much of a cough today. He denies a fever. Patient states that he has been seeing an ENT doctor who would like to monitor him to see if he will need his tonsils taken out in the future.  Patient is a Careers information officer at [employer] and states that his co-workers have been sick as well.   Patient saw Jeffery Stephenson October of last year- had positive strep test results.  He also saw Jeffery Stephenson in December of last year for sinus issues. He had a few episodes of tonsillitis prior to that.      There are no active problems to display for this patient.  Past Medical History  Diagnosis Date  . Hypertension    History reviewed. No pertinent past surgical history. Allergies  Allergen Reactions  . Keflex [Cephalexin] Rash    Skin rash after taking for days   Prior to Admission medications   Medication Sig Start Date End Date Taking? Authorizing Provider  fluticasone (FLONASE) 50 MCG/ACT nasal spray Place 2 sprays into both nostrils daily. Patient not taking: Reported on 04/29/2015 12/23/14   Jeffery Pean, PA-C  lisinopril-hydrochlorothiazide (PRINZIDE,ZESTORETIC) 10-12.5 MG tablet Take 1 tablet by mouth daily. 01/18/15   Jeffery Coop, PA-C   Social History   Social History  . Marital Status: Single    Spouse  Name: N/A  . Number of Children: N/A  . Years of Education: N/A   Occupational History  . Not on file.   Social History Main Topics  . Smoking status: Never Smoker   . Smokeless tobacco: Never Used  . Alcohol Use: No  . Drug Use: No  . Sexual Activity: Not on file   Other Topics Concern  . Not on file   Social History Narrative     Review of Systems  Constitutional: Negative for fever and chills.  HENT: Positive for sore throat. Negative for sinus pressure.   Eyes: Negative for pain, redness and itching.  Respiratory: Positive for cough. Negative for shortness of breath.   Gastrointestinal: Negative for nausea and vomiting.  Musculoskeletal: Negative for neck pain and neck stiffness.  Neurological: Negative for seizures, syncope and speech difficulty.       Objective:   Physical Exam  Constitutional: He is oriented to person, place, and time. He appears well-developed and well-nourished.  HENT:  Head: Normocephalic and atraumatic.  Right Ear: Tympanic membrane, external ear and ear canal normal.  Left Ear: Tympanic membrane, external ear and ear canal normal.  Nose: No rhinorrhea.  Mouth/Throat: Oropharynx is clear and moist and mucous membranes are normal. No oropharyngeal exudate or posterior oropharyngeal erythema.  Cryptic appearing tonsils with tonsillar hypertrophy right greater than left.  No apparent exudate of  blood.   Possible slight enlargement of AC nodes bilaterally non tender.  Sinuses are non tender.   Eyes: Conjunctivae are normal. Pupils are equal, round, and reactive to light.  Neck: Neck supple.  Cardiovascular: Normal rate, regular rhythm, normal heart sounds and intact distal pulses.   No murmur heard. Pulmonary/Chest: Effort normal and breath sounds normal. He has no wheezes. He has no rhonchi. He has no rales.  Abdominal: Soft. There is no tenderness.  Lymphadenopathy:    He has no cervical adenopathy.  Neurological: He is alert and oriented  to person, place, and time.  Skin: Skin is warm and dry. No rash noted.  Psychiatric: He has a normal mood and affect. His behavior is normal.  Vitals reviewed.  Filed Vitals:   04/29/15 0850  BP: 128/82  Pulse: 82  Temp: 98.1 F (36.7 C)  TempSrc: Oral  Resp: 16  Height: 5' 9.75" (1.772 m)  Weight: 264 lb 6.4 oz (119.931 kg)  SpO2: 98%   Results for orders placed or performed in visit on 04/29/15  POCT rapid strep A  Result Value Ref Range   Rapid Strep A Screen Negative Negative       Assessment & Plan:  Jeffery Stephenson is a 45 y.o. male Sore throat - Plan: POCT rapid strep A, Culture, Group A Strep  Upper respiratory infection - Plan: Culture, Group A Strep  Suspected viral illness, previous strep throat, but strep testing negative in the office, no exudate noted on tonsils. Throat culture pending. Symptomatic care discussed, RTC precautions.  No orders of the defined types were placed in this encounter.   Patient Instructions  Your strep test was negative, I will check a throat culture, and let you know if that is positive. For now can try Saline nasal spray at least 4 times per day, over the counter Cepacol or other lozenge as needed, drink plenty of fluids. Return to the clinic or go to the nearest emergency room if any of your symptoms worsen or new symptoms occur.  Sore Throat A sore throat is pain, burning, irritation, or scratchiness of the throat. There is often pain or tenderness when swallowing or talking. A sore throat may be accompanied by other symptoms, such as coughing, sneezing, fever, and swollen neck glands. A sore throat is often the first sign of another sickness, such as a cold, flu, strep throat, or mononucleosis (commonly known as mono). Most sore throats go away without medical treatment. CAUSES  The most common causes of a sore throat include:  A viral infection, such as a cold, flu, or mono.  A bacterial infection, such as strep throat,  tonsillitis, or whooping cough.  Seasonal allergies.  Dryness in the air.  Irritants, such as smoke or pollution.  Gastroesophageal reflux disease (GERD). HOME CARE INSTRUCTIONS   Only take over-the-counter medicines as directed by your caregiver.  Drink enough fluids to keep your urine clear or pale yellow.  Rest as needed.  Try using throat sprays, lozenges, or sucking on hard candy to ease any pain (if older than 4 years or as directed).  Sip warm liquids, such as broth, herbal tea, or warm water with honey to relieve pain temporarily. You may also eat or drink cold or frozen liquids such as frozen ice pops.  Gargle with salt water (mix 1 tsp salt with 8 oz of water).  Do not smoke and avoid secondhand smoke.  Put a cool-mist humidifier in your bedroom at night to moisten  the air. You can also turn on a hot shower and sit in the bathroom with the door closed for 5-10 minutes. SEEK IMMEDIATE MEDICAL CARE IF:  You have difficulty breathing.  You are unable to swallow fluids, soft foods, or your saliva.  You have increased swelling in the throat.  Your sore throat does not get better in 7 days.  You have nausea and vomiting.  You have a fever or persistent symptoms for more than 2-3 days.  You have a fever and your symptoms suddenly get worse. MAKE SURE YOU:   Understand these instructions.  Will watch your condition.  Will get help right away if you are not doing well or get worse.   This information is not intended to replace advice given to you by your health care provider. Make sure you discuss any questions you have with your health care provider.   Document Released: 03/18/2004 Document Revised: 03/01/2014 Document Reviewed: 10/17/2011 Elsevier Interactive Patient Education 2016 Elsevier Inc.  Upper Respiratory Infection, Adult Most upper respiratory infections (URIs) are a viral infection of the air passages leading to the lungs. A URI affects the nose,  throat, and upper air passages. The most common type of URI is nasopharyngitis and is typically referred to as "the common cold." URIs run their course and usually go away on their own. Most of the time, a URI does not require medical attention, but sometimes a bacterial infection in the upper airways can follow a viral infection. This is called a secondary infection. Sinus and middle ear infections are common types of secondary upper respiratory infections. Bacterial pneumonia can also complicate a URI. A URI can worsen asthma and chronic obstructive pulmonary disease (COPD). Sometimes, these complications can require emergency medical care and may be life threatening.  CAUSES Almost all URIs are caused by viruses. A virus is a type of germ and can spread from one person to another.  RISKS FACTORS You may be at risk for a URI if:   You smoke.   You have chronic heart or lung disease.  You have a weakened defense (immune) system.   You are very young or very old.   You have nasal allergies or asthma.  You work in crowded or poorly ventilated areas.  You work in health care facilities or schools. SIGNS AND SYMPTOMS  Symptoms typically develop 2-3 days after you come in contact with a cold virus. Most viral URIs last 7-10 days. However, viral URIs from the influenza virus (flu virus) can last 14-18 days and are typically more severe. Symptoms may include:   Runny or stuffy (congested) nose.   Sneezing.   Cough.   Sore throat.   Headache.   Fatigue.   Fever.   Loss of appetite.   Pain in your forehead, behind your eyes, and over your cheekbones (sinus pain).  Muscle aches.  DIAGNOSIS  Your health care provider may diagnose a URI by:  Physical exam.  Tests to check that your symptoms are not due to another condition such as:  Strep throat.  Sinusitis.  Pneumonia.  Asthma. TREATMENT  A URI goes away on its own with time. It cannot be cured with  medicines, but medicines may be prescribed or recommended to relieve symptoms. Medicines may help:  Reduce your fever.  Reduce your cough.  Relieve nasal congestion. HOME CARE INSTRUCTIONS   Take medicines only as directed by your health care provider.   Gargle warm saltwater or take cough drops to comfort  your throat as directed by your health care provider.  Use a warm mist humidifier or inhale steam from a shower to increase air moisture. This may make it easier to breathe.  Drink enough fluid to keep your urine clear or pale yellow.   Eat soups and other clear broths and maintain good nutrition.   Rest as needed.   Return to work when your temperature has returned to normal or as your health care provider advises. You may need to stay home longer to avoid infecting others. You can also use a face mask and careful hand washing to prevent spread of the virus.  Increase the usage of your inhaler if you have asthma.   Do not use any tobacco products, including cigarettes, chewing tobacco, or electronic cigarettes. If you need help quitting, ask your health care provider. PREVENTION  The best way to protect yourself from getting a cold is to practice good hygiene.   Avoid oral or hand contact with people with cold symptoms.   Wash your hands often if contact occurs.  There is no clear evidence that vitamin C, vitamin E, echinacea, or exercise reduces the chance of developing a cold. However, it is always recommended to get plenty of rest, exercise, and practice good nutrition.  SEEK MEDICAL CARE IF:   You are getting worse rather than better.   Your symptoms are not controlled by medicine.   You have chills.  You have worsening shortness of breath.  You have brown or red mucus.  You have yellow or brown nasal discharge.  You have pain in your face, especially when you bend forward.  You have a fever.  You have swollen neck glands.  You have pain while  swallowing.  You have white areas in the back of your throat. SEEK IMMEDIATE MEDICAL CARE IF:   You have severe or persistent:  Headache.  Ear pain.  Sinus pain.  Chest pain.  You have chronic lung disease and any of the following:  Wheezing.  Prolonged cough.  Coughing up blood.  A change in your usual mucus.  You have a stiff neck.  You have changes in your:  Vision.  Hearing.  Thinking.  Mood. MAKE SURE YOU:   Understand these instructions.  Will watch your condition.  Will get help right away if you are not doing well or get worse.   This information is not intended to replace advice given to you by your health care provider. Make sure you discuss any questions you have with your health care provider.   Document Released: 08/04/2000 Document Revised: 06/25/2014 Document Reviewed: 05/16/2013 Elsevier Interactive Patient Education [employer].      I personally performed the services described in this documentation, which was scribed in my presence. The recorded information has been reviewed and considered, and addended by me as needed.

## 2015-04-30 LAB — CULTURE, GROUP A STREP: Organism ID, Bacteria: NORMAL

## 2015-05-03 ENCOUNTER — Telehealth: Payer: Self-pay

## 2015-05-03 NOTE — Telephone Encounter (Signed)
Pt called to request an antibiotic for an ongoing illness that he has been dealing with. Pt was seen by Dr. Carlota Raspberry 04/29/15 and is not feeling any better. I explained that it may be best for him to come back in and be seen rather than waiting for a return phone call; pt understood.

## 2015-12-20 ENCOUNTER — Other Ambulatory Visit: Payer: Self-pay | Admitting: Physician Assistant

## 2015-12-20 DIAGNOSIS — Z8679 Personal history of other diseases of the circulatory system: Secondary | ICD-10-CM

## 2015-12-31 DIAGNOSIS — I10 Essential (primary) hypertension: Secondary | ICD-10-CM | POA: Insufficient documentation

## 2016-08-16 DIAGNOSIS — J0391 Acute recurrent tonsillitis, unspecified: Secondary | ICD-10-CM | POA: Insufficient documentation

## 2016-12-31 DIAGNOSIS — R7303 Prediabetes: Secondary | ICD-10-CM | POA: Insufficient documentation

## 2017-01-24 DIAGNOSIS — R519 Headache, unspecified: Secondary | ICD-10-CM | POA: Insufficient documentation

## 2018-07-24 HISTORY — PX: TONSILLECTOMY: SUR1361

## 2019-05-24 ENCOUNTER — Encounter: Payer: Self-pay | Admitting: Internal Medicine

## 2019-06-22 ENCOUNTER — Ambulatory Visit (AMBULATORY_SURGERY_CENTER): Payer: Self-pay | Admitting: *Deleted

## 2019-06-22 ENCOUNTER — Other Ambulatory Visit: Payer: Self-pay

## 2019-06-22 VITALS — Temp 96.8°F | Ht 70.5 in | Wt 273.0 lb

## 2019-06-22 DIAGNOSIS — Z1211 Encounter for screening for malignant neoplasm of colon: Secondary | ICD-10-CM

## 2019-06-22 MED ORDER — SUTAB 1479-225-188 MG PO TABS: 24.0000 | ORAL_TABLET | ORAL | 0 refills | Status: DC

## 2019-06-22 NOTE — Progress Notes (Signed)
No egg or soy allergy known to patient  No issues with past sedation with any surgeries  or procedures, no intubation problems  No diet pills per patient No home 02 use per patient  No blood thinners per patient  Pt denies issues with constipation  No A fib or A flutter  EMMI video sent to pt's e mail   Pt had [facility] 04-11-2019 and will not require Pre SCreen Covid test prior to Colon 5-14  Sutab code to Pharmacy and Coupon to pt in [place]   Due to the COVID-19 pandemic we are asking patients to follow these guidelines. Please only bring one care partner. Please be aware that your care partner may wait in the car in the parking lot or if they feel like they will be too hot to wait in the car, they may wait in the lobby on the 4th floor. All care partners are required to wear a mask the entire time (we do not have any that we can provide them), they need to practice social distancing, and we will do a Covid check for all patient's and care partners when you arrive. Also we will check their temperature and your temperature. If the care partner waits in their car they need to stay in the parking lot the entire time and we will call them on their cell phone when the patient is ready for discharge so they can bring the car to the front of the building. Also all patient's will need to wear a mask into building.

## 2019-07-04 ENCOUNTER — Encounter: Payer: Self-pay | Admitting: Internal Medicine

## 2019-07-06 ENCOUNTER — Encounter: Payer: Self-pay | Admitting: Internal Medicine

## 2019-07-06 ENCOUNTER — Other Ambulatory Visit: Payer: Self-pay

## 2019-07-06 ENCOUNTER — Ambulatory Visit (AMBULATORY_SURGERY_CENTER): Payer: BC Managed Care – PPO | Admitting: Internal Medicine

## 2019-07-06 VITALS — BP 136/86 | HR 71 | Temp 96.6°F | Resp 14 | Ht 70.5 in | Wt 273.0 lb

## 2019-07-06 DIAGNOSIS — Z1211 Encounter for screening for malignant neoplasm of colon: Secondary | ICD-10-CM

## 2019-07-06 DIAGNOSIS — D123 Benign neoplasm of transverse colon: Secondary | ICD-10-CM

## 2019-07-06 MED ORDER — SODIUM CHLORIDE 0.9 % IV SOLN: 500.0000 mL | Freq: Once | INTRAVENOUS | Status: DC

## 2019-07-06 NOTE — Progress Notes (Signed)
KA- vitals LS- temp

## 2019-07-06 NOTE — Op Note (Addendum)
[place] Patient Name: Jeffery Stephenson Procedure Date: 07/06/2019 1:48 PM MRN: [id number] Endoscopist: Docia Chuck. Henrene Pastor , MD Age: 49 Referring MD:  Date of Birth: [birth year] Gender: Male Account #: [id number] Procedure:                Colonoscopy with cold snare polypectomy x 1 Indications:              Screening for colorectal malignant neoplasm Medicines:                Monitored Anesthesia Care Procedure:                Pre-Anesthesia Assessment:                           - Prior to the procedure, a History and Physical                            was performed, and patient medications and                            allergies were reviewed. The patient's tolerance of                            previous anesthesia was also reviewed. The risks                            and benefits of the procedure and the sedation                            options and risks were discussed with the patient.                            All questions were answered, and informed consent                            was obtained. Prior Anticoagulants: The patient has                            taken no previous anticoagulant or antiplatelet                            agents. ASA Grade Assessment: II - A patient with                            mild systemic disease. After reviewing the risks                            and benefits, the patient was deemed in                            satisfactory condition to undergo the procedure.                           After obtaining informed consent, the colonoscope  was passed under direct vision. Throughout the                            procedure, the patient's blood pressure, pulse, and                            oxygen saturations were monitored continuously. The                            Colonoscope was introduced through the anus and                            advanced to the the cecum, identified by   appendiceal orifice and ileocecal valve. The                            ileocecal valve, appendiceal orifice, and rectum                            were photographed. The quality of the bowel                            preparation was excellent. The colonoscopy was                            performed without difficulty. The patient tolerated                            the procedure well. The bowel preparation used was                            SUPREP via split dose instruction. Scope In: 2:08:24 PM Scope Out: 2:21:56 PM Scope Withdrawal Time: 0 hours 12 minutes 10 seconds  Total Procedure Duration: 0 hours 13 minutes 32 seconds  Findings:                 A 2 mm polyp was found in the transverse colon. The                            polyp was removed with a cold snare. Resection and                            retrieval were complete.                           Multiple diverticula were found in the left colon                            and right colon. Small internal hemorrhoids.                           The exam was otherwise without abnormality on                            direct  and retroflexion views. Complications:            No immediate complications. Estimated blood loss:                            None. Estimated Blood Loss:     Estimated blood loss: none. Impression:               - One 2 mm polyp in the transverse colon, removed                            with a cold snare. Resected and retrieved.                           - Diverticulosis in the left colon and in the right                            colon.                           - The examination was otherwise normal on direct                            and retroflexion views. Recommendation:           - Repeat colonoscopy in 7-10 years for surveillance.                           - Patient has a contact number available for                            emergencies. The signs and symptoms of potential                             delayed complications were discussed with the                            patient. Return to normal activities tomorrow.                            Written discharge instructions were provided to the                            patient.                           - Resume previous diet.                           - Continue present medications.                           - Await pathology results. Docia Chuck. Henrene Pastor, MD 07/06/2019 2:29:59 PM This report has been signed electronically.

## 2019-07-06 NOTE — Patient Instructions (Signed)
Handouts provided on polyps and diverticulosis.  ? ?YOU HAD AN ENDOSCOPIC PROCEDURE TODAY AT THE [facility]:   Refer to the procedure report that was given to you for any specific questions about what was found during the examination.  If the procedure report does not answer your questions, please call your gastroenterologist to clarify.  If you requested that your care partner not be given the details of your procedure findings, then the procedure report has been included in a sealed envelope for you to review at your convenience later. ? ?YOU SHOULD EXPECT: Some feelings of bloating in the abdomen. Passage of more gas than usual.  Walking can help get rid of the air that was put into your GI tract during the procedure and reduce the bloating. If you had a lower endoscopy (such as a colonoscopy or flexible sigmoidoscopy) you may notice spotting of blood in your stool or on the toilet paper. If you underwent a bowel prep for your procedure, you may not have a normal bowel movement for a few days. ? ?Please Note:  You might notice some irritation and congestion in your nose or some drainage.  This is from the oxygen used during your procedure.  There is no need for concern and it should clear up in a day or so. ? ?SYMPTOMS TO REPORT IMMEDIATELY: ? ?Following lower endoscopy (colonoscopy or flexible sigmoidoscopy): ? Excessive amounts of blood in the stool ? Significant tenderness or worsening of abdominal pains ? Swelling of the abdomen that is new, acute ? Fever of 100?F or higher ? ?For urgent or emergent issues, a gastroenterologist can be reached at any hour by calling [phone]. ?Do not use MyChart messaging for urgent concerns.  ? ? ?DIET:  We do recommend a small meal at first, but then you may proceed to your regular diet.  Drink plenty of fluids but you should avoid alcoholic beverages for 24 hours. ? ?ACTIVITY:  You should plan to take it easy for the rest of today and you should NOT  DRIVE or use heavy machinery until tomorrow (because of the sedation medicines used during the test).   ? ?FOLLOW UP: ?Our staff will call the number listed on your records 48-72 hours following your procedure to check on you and address any questions or concerns that you may have regarding the information given to you following your procedure. If we do not reach you, we will leave a message.  We will attempt to reach you two times.  During this call, we will ask if you have developed any symptoms of COVID 19. If you develop any symptoms (ie: fever, flu-like symptoms, shortness of breath, cough etc.) before then, please call [phone].  If you test positive for Covid 19 in the 2 weeks post procedure, please call and report this information to us.   ? ?If any biopsies were taken you will be contacted by phone or by letter within the next 1-3 weeks.  Please call us at [phone] if you have not heard about the biopsies in 3 weeks.  ? ? ?SIGNATURES/CONFIDENTIALITY: ?You and/or your care partner have signed paperwork which will be entered into your electronic medical record.  These signatures attest to the fact that that the information above on your After Visit Summary has been reviewed and is understood.  Full responsibility of the confidentiality of this discharge information lies with you and/or your care-partner. ? ?

## 2019-07-06 NOTE — Progress Notes (Signed)
A/ox3, pleased with MAC, report to RN 

## 2019-07-06 NOTE — Progress Notes (Signed)
Called to room to assist during endoscopic procedure.  Patient ID and intended procedure confirmed with present staff. Received instructions for my participation in the procedure from the performing physician.  

## 2019-07-06 NOTE — Progress Notes (Signed)
Pt's states no medical or surgical changes since previsit or office visit. 

## 2019-07-10 ENCOUNTER — Telehealth: Payer: Self-pay | Admitting: *Deleted

## 2019-07-10 NOTE — Telephone Encounter (Signed)
  Follow up Call-  Call back number 07/06/2019  Post procedure Call Back phone  # [phone]  Permission to leave phone message Yes  Some recent data might be hidden     Patient questions:  Do you have a fever, pain , or abdominal swelling? No. Pain Score  0 *  Have you tolerated food without any problems? Yes.    Have you been able to return to your normal activities? Yes.    Do you have any questions about your discharge instructions: Diet   No. Medications  No. Follow up visit  No.  Do you have questions or concerns about your Care? No.  Actions: * If pain score is 4 or above: No action needed, pain <4.  1. Have you developed a fever since your procedure? no  2.   Have you had an respiratory symptoms (SOB or cough) since your procedure? no  3.   Have you tested positive for COVID 19 since your procedure no  4.   Have you had any family members/close contacts diagnosed with the COVID 19 since your procedure?  no   If yes to any of these questions please route to Joylene John, RN and Erenest Rasher, RN

## 2019-07-11 ENCOUNTER — Encounter: Payer: Self-pay | Admitting: Internal Medicine

## 2020-10-27 ENCOUNTER — Emergency Department (HOSPITAL_COMMUNITY): Payer: BC Managed Care – PPO

## 2020-10-27 ENCOUNTER — Other Ambulatory Visit: Payer: Self-pay

## 2020-10-27 ENCOUNTER — Emergency Department (HOSPITAL_COMMUNITY)
Admission: EM | Admit: 2020-10-27 | Discharge: 2020-10-27 | Disposition: A | Discharge status: Home / Self Care | Payer: BC Managed Care – PPO | Attending: Emergency Medicine | Admitting: Emergency Medicine

## 2020-10-27 ENCOUNTER — Encounter (HOSPITAL_COMMUNITY): Payer: Self-pay

## 2020-10-27 DIAGNOSIS — U071 COVID-19: Secondary | ICD-10-CM | POA: Insufficient documentation

## 2020-10-27 DIAGNOSIS — I10 Essential (primary) hypertension: Secondary | ICD-10-CM | POA: Diagnosis not present

## 2020-10-27 DIAGNOSIS — J029 Acute pharyngitis, unspecified: Secondary | ICD-10-CM | POA: Diagnosis present

## 2020-10-27 LAB — CBC
HCT: 44 % (ref 39.0–52.0)
Hemoglobin: 14.3 g/dL (ref 13.0–17.0)
MCH: 28.3 pg (ref 26.0–34.0)
MCHC: 32.5 g/dL (ref 30.0–36.0)
MCV: 87.1 fL (ref 80.0–100.0)
Platelets: 161 10*3/uL (ref 150–400)
RBC: 5.05 MIL/uL (ref 4.22–5.81)
RDW: 13.3 % (ref 11.5–15.5)
WBC: 7.7 10*3/uL (ref 4.0–10.5)
nRBC: 0 % (ref 0.0–0.2)

## 2020-10-27 LAB — COMPREHENSIVE METABOLIC PANEL
ALT: 29 U/L (ref 0–44)
AST: 24 U/L (ref 15–41)
Albumin: 4 g/dL (ref 3.5–5.0)
Alkaline Phosphatase: 55 U/L (ref 38–126)
Anion gap: 7 (ref 5–15)
BUN: 11 mg/dL (ref 6–20)
CO2: 26 mmol/L (ref 22–32)
Calcium: 9 mg/dL (ref 8.9–10.3)
Chloride: 105 mmol/L (ref 98–111)
Creatinine, Ser: 1.21 mg/dL (ref 0.61–1.24)
GFR, Estimated: >60 mL/min (ref >60)
Glucose, Bld: 112 mg/dL — ABNORMAL HIGH (ref 70–99)
Potassium: 4 mmol/L (ref 3.5–5.1)
Sodium: 138 mmol/L (ref 135–145)
Total Bilirubin: 0.9 mg/dL (ref 0.3–1.2)
Total Protein: 7.1 g/dL (ref 6.5–8.1)

## 2020-10-27 LAB — GROUP A STREP BY PCR: Group A Strep by PCR: NOT DETECTED

## 2020-10-27 MED ORDER — IOHEXOL 350 MG/ML SOLN: 65.0000 mL | Freq: Once | INTRAVENOUS | Status: DC | PRN

## 2020-10-27 MED ORDER — DEXAMETHASONE SODIUM PHOSPHATE 10 MG/ML IJ SOLN
10.0000 mg | Freq: Once | INTRAMUSCULAR | Status: AC
  Administered 2020-10-27: 10 mg via INTRAVENOUS
  Filled 2020-10-27: qty 1

## 2020-10-27 MED ORDER — DEXAMETHASONE SODIUM PHOSPHATE 10 MG/ML IJ SOLN: 10.0000 mg | Freq: Once | INTRAMUSCULAR | Status: DC

## 2020-10-27 NOTE — ED Triage Notes (Signed)
Pt reports testing positive for Covid on Friday. Pt states that his throat pain has gotten worse and wants to be tested for strept.

## 2020-10-27 NOTE — Discharge Instructions (Addendum)
You were evaluated in the Emergency Department and after careful evaluation, we did not find any emergent condition requiring admission or further testing in the hospital.  Your exam/testing today was overall reassuring.  Your strep test was negative.  Symptoms can be explained by COVID-19.  Given the severity of your throat pain we are providing you with a dose of steroids here in the emergency department.  Should start feeling better over the next several hours.  Please return to the Emergency Department if you experience any worsening of your condition.  Thank you for allowing [place] to be a part of your care.

## 2020-10-27 NOTE — ED Provider Notes (Signed)
[facility] Emergency Department Provider Note MRN:  [id number]  Arrival date & time: 10/27/20     Chief Complaint   Sore Throat   History of Present Illness   Jeffery Stephenson is a 50 y.o. year-old male with a history of hypertension presenting to the ED with chief complaint of sore throat.  Location: Throat Duration: 1 or 2 days Onset: Gradual Timing: Constant pain Description: Raw throat Severity: Severe Exacerbating/Alleviating Factors: Worse with any swallowing Associated Symptoms: Fever Pertinent Negatives: No chest pain or shortness of breath, no abdominal pain, no rash, no headache  Additional History: Tested positive for COVID at home  Review of Systems  A complete 10 system review of systems was obtained and all systems are negative except as noted in the HPI and PMH.   Patient's Health History    Past Medical History:  Diagnosis Date   Allergy    mild    Arthritis    ankle - s/p injury    Hypertension     Past Surgical History:  Procedure Laterality Date   TONSILLECTOMY  07/2018    Family History  Problem Relation Age of Onset   Colon cancer Neg Hx    Colon polyps Neg Hx    Esophageal cancer Neg Hx    Rectal cancer Neg Hx    Stomach cancer Neg Hx     Social History   Socioeconomic History   Marital status: Single    Spouse name: Not on file   Number of children: Not on file   Years of education: Not on file   Highest education level: Not on file  Occupational History   Not on file  Tobacco Use   Smoking status: Never   Smokeless tobacco: Never  Substance and Sexual Activity   Alcohol use: No    Alcohol/week: 0.0 standard drinks   Drug use: No   Sexual activity: Not on file  Other Topics Concern   Not on file  Social History Narrative   Not on file   Social Determinants of Health   Financial Resource Strain: Not on file  Food Insecurity: Not on file  Transportation Needs: Not on file  Physical Activity: Not  on file  Stress: Not on file  Social Connections: Not on file  Intimate Partner Violence: Not on file     Physical Exam   Vitals:   10/27/20 0250  BP: (!) 143/101  Pulse: (!) 110  Resp: 16  Temp: (!) 100.5 F (38.1 C)  SpO2: 100%    CONSTITUTIONAL: Well-appearing, NAD NEURO:  Alert and oriented x 3, no focal deficits EYES:  eyes equal and reactive ENT/NECK:  no LAD, no JVD CARDIO: Regular rate, well-perfused, normal S1 and S2 PULM:  CTAB no wheezing or rhonchi GI/GU:  normal bowel sounds, non-distended, non-tender MSK/SPINE:  No gross deformities, no edema SKIN:  no rash, atraumatic PSYCH:  Appropriate speech and behavior  *Additional and/or pertinent findings included in MDM below  Diagnostic and Interventional Summary    EKG Interpretation  Date/Time:    Ventricular Rate:    PR Interval:    QRS Duration:   QT Interval:    QTC Calculation:   R Axis:     Text Interpretation:         Labs Reviewed  GROUP A STREP BY PCR    No orders to display    Medications  dexamethasone (DECADRON) injection 10 mg (has no administration in time range)  Procedures  /  Critical Care Procedures  ED Course and Medical Decision Making  I have reviewed the triage vital signs, the nursing notes, and pertinent available records from the EMR.  Listed above are laboratory and imaging tests that I personally ordered, reviewed, and interpreted and then considered in my medical decision making (see below for details).  Symptoms seem well explained by COVID-19 however patient is having severe throat pain, having trouble swallowing his own saliva.  His oropharynx actually appears quite normal.  Voice seems a bit different.  Given these findings, will obtain CT imaging to exclude retropharyngeal abscess.     I am informed the patient is feeling a lot better after the steroid shot.  He is requesting discharge, not interested in the CT scan.  On repeat evaluation his voice is  normal, he is sitting comfortably, no acute distress.  Overall suspicion for deep space infection or emergent process is very low, strict return precautions, will hold off on CT imaging.  Barth Kirks. Sedonia Small, MD [place] [email or web address]  Final Clinical Impressions(s) / ED Diagnoses     ICD-10-CM   1. COVID-19  U07.1     2. Sore throat  J02.9       ED Discharge Orders     None        Discharge Instructions Discussed with and Provided to Patient:    Discharge Instructions      You were evaluated in the Emergency Department and after careful evaluation, we did not find any emergent condition requiring admission or further testing in the hospital.  Your exam/testing today was overall reassuring.  Your strep test was negative.  Symptoms can be explained by COVID-19.  Given the severity of your throat pain we are providing you with a dose of steroids here in the emergency department.  Should start feeling better over the next several hours.  Please return to the Emergency Department if you experience any worsening of your condition.  Thank you for allowing [place] to be a part of your care.        Maudie Flakes, MD 10/27/20 [phone]
